# Patient Record
Sex: Male | Born: 1985 | Hispanic: Yes | Marital: Married
Health system: Southern US, Academic
[De-identification: ages and names within clinical notes are randomized; demographics above are authoritative.]

---

## 2022-11-28 ENCOUNTER — Emergency Department (HOSPITAL_COMMUNITY): Payer: BC Managed Care – PPO

## 2022-11-28 ENCOUNTER — Emergency Department (HOSPITAL_COMMUNITY)
Admission: RE | Admit: 2022-11-28 | Discharge: 2022-11-28 | Disposition: A | Discharge status: Home / Self Care | Payer: BC Managed Care – PPO | Source: Ambulatory Visit

## 2022-11-28 ENCOUNTER — Other Ambulatory Visit: Payer: Self-pay

## 2022-11-28 ENCOUNTER — Encounter (HOSPITAL_COMMUNITY): Payer: Self-pay

## 2022-11-28 ENCOUNTER — Emergency Department
Admission: EM | Admit: 2022-11-28 | Discharge: 2022-11-28 | Disposition: A | Discharge status: Home / Self Care | Payer: BC Managed Care – PPO | Attending: Emergency Medicine | Admitting: Emergency Medicine

## 2022-11-28 DIAGNOSIS — M542 Cervicalgia: Secondary | ICD-10-CM | POA: Insufficient documentation

## 2022-11-28 DIAGNOSIS — S3991XA Unspecified injury of abdomen, initial encounter: Secondary | ICD-10-CM | POA: Insufficient documentation

## 2022-11-28 DIAGNOSIS — M25512 Pain in left shoulder: Secondary | ICD-10-CM | POA: Insufficient documentation

## 2022-11-28 DIAGNOSIS — M549 Dorsalgia, unspecified: Secondary | ICD-10-CM | POA: Insufficient documentation

## 2022-11-28 DIAGNOSIS — R519 Headache, unspecified: Secondary | ICD-10-CM | POA: Insufficient documentation

## 2022-11-28 DIAGNOSIS — M545 Low back pain, unspecified: Secondary | ICD-10-CM

## 2022-11-28 LAB — COMPREHENSIVE METABOLIC PANEL, NON-FASTING
ALBUMIN/GLOBULIN RATIO: 1.3 — ABNORMAL LOW (ref 1.5–2.5)
ALBUMIN: 4.4 g/dL (ref 3.5–5.0)
ALKALINE PHOSPHATASE: 72 U/L (ref 38–126)
ALT (SGPT): 44 U/L (ref <50)
ANION GAP: 9 mmol/L (ref 5–19)
AST (SGOT): 39 U/L (ref 17–59)
BILIRUBIN TOTAL: 0.6 mg/dL (ref 0.2–1.3)
BUN/CREA RATIO: 13 (ref 6–20)
BUN: 15 mg/dL (ref 9–20)
CALCIUM: 9.8 mg/dL (ref 8.4–10.2)
CHLORIDE: 106 mmol/L (ref 98–107)
CO2 TOTAL: 27 mmol/L (ref 22–30)
CREATININE: 1.14 mg/dL (ref 0.66–1.20)
ESTIMATED GFR: >60 mL/min/{1.73_m2} (ref >60)
GLUCOSE: 85 mg/dL (ref 74–106)
POTASSIUM: 4.3 mmol/L (ref 3.5–5.1)
PROTEIN TOTAL: 7.7 g/dL (ref 6.3–8.2)
SODIUM: 142 mmol/L (ref 137–145)

## 2022-11-28 LAB — CBC WITH DIFF
BASOPHIL #: 0 10*3/uL (ref 0.00–0.20)
BASOPHIL %: 1 % (ref 0–2)
EOSINOPHIL #: 0.1 10*3/uL (ref 0.00–0.60)
EOSINOPHIL %: 2 % (ref 0–5)
HCT: 46.9 % — ABNORMAL HIGH (ref 36.0–46.0)
HGB: 16.3 g/dL (ref 13.9–16.3)
LYMPHOCYTE #: 2.5 10*3/uL (ref 1.10–3.80)
LYMPHOCYTE %: 34 % (ref 19–46)
MCH: 30.9 pg (ref 25.4–34.0)
MCHC: 34.6 g/dL (ref 30.0–37.0)
MCV: 89.1 fL (ref 80.0–100.0)
MONOCYTE #: 0.5 10*3/uL (ref 0.10–0.80)
MONOCYTE %: 7 % (ref 4–12)
MPV: 7.5 fL (ref 7.5–11.5)
NEUTROPHIL #: 4 10*3/uL (ref 1.80–7.50)
NEUTROPHIL %: 56 % (ref 41–69)
PLATELETS: 225 10*3/uL (ref 130–400)
RBC: 5.27 10*6/uL (ref 4.30–5.90)
RDW: 13.4 % (ref 11.5–14.0)
WBC: 7.2 10*3/uL (ref 4.5–11.5)

## 2022-11-28 LAB — ETHANOL, SERUM/PLASMA
ETHANOL: <10 mg/dL (ref <10)
ETHANOL: NOT DETECTED

## 2022-11-28 LAB — LIPASE: LIPASE: 72 U/L (ref 23–300)

## 2022-11-28 LAB — ECG 12 LEAD
Calculated P Axis: 14 degrees
Calculated R Axis: 68 degrees
Calculated T Axis: 17 degrees
PR Interval: 146 ms
QRS Duration: 84 ms
QT Interval: 340 ms
QTC Calculation: 373 ms
Ventricular rate: 78 {beats}/min

## 2022-11-28 LAB — TYPE AND SCREEN
ABO/RH(D): O POS
ANTIBODY SCREEN: NEGATIVE

## 2022-11-28 LAB — LIGHT GREEN TOP TUBE

## 2022-11-28 LAB — PT/INR
INR: 1.02 (ref 0.87–1.18)
PROTHROMBIN TIME: 10.5 seconds (ref 9.0–12.0)

## 2022-11-28 MED ORDER — IOPAMIDOL 300 MG IODINE/ML (61 %) INTRAVENOUS SOLUTION
110.0000 mL | INTRAVENOUS | Status: AC
Start: 2022-11-28 — End: 2022-11-28
  Administered 2022-11-28: 110 mL via INTRAVENOUS

## 2022-11-28 MED ORDER — ACETAMINOPHEN 1,000 MG/100 ML (10 MG/ML) INTRAVENOUS SOLUTION
1000.0000 mg | INTRAVENOUS | Status: AC
Start: 2022-11-28 — End: 2022-11-28
  Administered 2022-11-28: 0 mg via INTRAVENOUS
  Administered 2022-11-28: 1000 mg via INTRAVENOUS
  Filled 2022-11-28: qty 100

## 2022-11-28 MED ORDER — KETOROLAC 30 MG/ML (1 ML) INJECTION SOLUTION
30.0000 mg | INTRAMUSCULAR | Status: AC
Start: 2022-11-28 — End: 2022-11-28
  Administered 2022-11-28: 30 mg via INTRAVENOUS
  Filled 2022-11-28: qty 1
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
## 2022-11-29 ENCOUNTER — Telehealth (HOSPITAL_COMMUNITY): Payer: Self-pay

## 2022-11-29 NOTE — Telephone Encounter (Signed)
Jack Travis @[phone] called the provider locator line on 11/29/2022 @1253  requesting assistance in locating a PCP. I sent an e-mail to Marchelle Folks in [place] and she is contacting the pt to set up an appt with either Dr. Graciela Husbands or NP Burton Apley.
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
